# Patient Record
Sex: Female | Born: 1975 | Race: White | Hispanic: No | Marital: Married | Smoking: Former smoker
Health system: Southern US, Community
[De-identification: ages and names within clinical notes are randomized; demographics above are authoritative.]

## PROBLEM LIST (undated history)

## (undated) DIAGNOSIS — K219 Gastro-esophageal reflux disease without esophagitis: Secondary | ICD-10-CM

## (undated) DIAGNOSIS — F32A Depression, unspecified: Secondary | ICD-10-CM

## (undated) DIAGNOSIS — E282 Polycystic ovarian syndrome: Secondary | ICD-10-CM

## (undated) DIAGNOSIS — I10 Essential (primary) hypertension: Secondary | ICD-10-CM

## (undated) DIAGNOSIS — E119 Type 2 diabetes mellitus without complications: Secondary | ICD-10-CM

## (undated) DIAGNOSIS — F419 Anxiety disorder, unspecified: Secondary | ICD-10-CM

## (undated) DIAGNOSIS — R011 Cardiac murmur, unspecified: Secondary | ICD-10-CM

## (undated) DIAGNOSIS — G43909 Migraine, unspecified, not intractable, without status migrainosus: Secondary | ICD-10-CM

## (undated) HISTORY — DX: Cardiac murmur, unspecified: R01.1

## (undated) HISTORY — DX: Anxiety disorder, unspecified: F41.9

## (undated) HISTORY — DX: Type 2 diabetes mellitus without complications: E11.9

## (undated) HISTORY — DX: Depression, unspecified: F32.A

## (undated) HISTORY — PX: PILONIDAL CYST EXCISION: SHX744

## (undated) HISTORY — DX: Polycystic ovarian syndrome: E28.2

## (undated) HISTORY — DX: Gastro-esophageal reflux disease without esophagitis: K21.9

## (undated) HISTORY — DX: Migraine, unspecified, not intractable, without status migrainosus: G43.909

## (undated) HISTORY — DX: Essential (primary) hypertension: I10

---

## 2021-03-20 ENCOUNTER — Encounter: Payer: Self-pay | Admitting: Gastroenterology

## 2021-05-02 ENCOUNTER — Ambulatory Visit: Payer: Self-pay | Admitting: Gastroenterology

## 2021-05-13 ENCOUNTER — Ambulatory Visit: Payer: Self-pay | Admitting: Gastroenterology

## 2021-05-16 ENCOUNTER — Encounter: Payer: Self-pay | Admitting: Gastroenterology

## 2021-05-21 ENCOUNTER — Encounter: Payer: Self-pay | Admitting: Gastroenterology

## 2021-05-21 ENCOUNTER — Other Ambulatory Visit (INDEPENDENT_AMBULATORY_CARE_PROVIDER_SITE_OTHER): Payer: Managed Care, Other (non HMO)

## 2021-05-21 ENCOUNTER — Ambulatory Visit: Payer: Managed Care, Other (non HMO) | Admitting: Gastroenterology

## 2021-05-21 VITALS — BP 136/86 | HR 78 | Ht 62.0 in | Wt 183.1 lb

## 2021-05-21 DIAGNOSIS — R1013 Epigastric pain: Secondary | ICD-10-CM | POA: Diagnosis not present

## 2021-05-21 DIAGNOSIS — R1084 Generalized abdominal pain: Secondary | ICD-10-CM | POA: Diagnosis not present

## 2021-05-21 DIAGNOSIS — Z8719 Personal history of other diseases of the digestive system: Secondary | ICD-10-CM

## 2021-05-21 DIAGNOSIS — K219 Gastro-esophageal reflux disease without esophagitis: Secondary | ICD-10-CM

## 2021-05-21 LAB — LIPASE: Lipase: 55 U/L (ref 11.0–59.0)

## 2021-05-21 NOTE — Progress Notes (Signed)
HPI : Nancy Harvey is a very pleasant 45 year old female with history of diabetes, anxiety and depression who is referred to [place] by Denny Levy, PA for further evaluation of persistent abdominal pain and a recent history of pancreatitis states that she started having abdominal pain in June of this year.  Pain was located in the upper abdomen and was associated with sudden violent vomiting episodes.  The pain was constant and was severe (10 out of 10).  She was found to have an elevated lipase (196) and a subsequent ultrasound showed no gallstones, but did show a prominent pancreatic head.  She had been on Ozempic since March of this year.  This medication was stopped after the diagnosis of pancreatitis.  Her pain improved after stopping the Ozempic, but she has continued to have persistent symptoms of recurrent abdominal pain, which is similar in location and nature to the pain with her pancreatitis, but not as severe.  Pain does not daily, but does occur a few times a week.  When it does occur it tends to last for hours at a time.  She rates it as a 7 out of 10 most of the time.  She has bowel movements 1-2 times a day.  Her stools are frequently poorly formed which is been her usual ever since she started taking metformin.  She denies any vomiting episodes in the past month, but she does report continued poor appetite.  She has lost 10 pounds in the past couple months due to poor appetite. She has also bothered by chronic GERD symptoms which consist of burning pain in the chest and occasional regurgitation.  The symptoms have been present for a long time but are also much more bothersome in the past 3 months.  She has been taking Nexium with partial improvement. 10 years ago, she was having similar upper abdominal pain symptoms.  An ultrasound was done at that time for gallstones but this was negative.  She does not think her lipase was elevated at that time.  The symptoms resolved spontaneously. She has an  uncle who was diagnosed with pancreatic cancer in his late 7s.  The patient has a remote history of smoking and is a very rare drinker of alcohol.    Past Medical History:  Diagnosis Date   Anxiety    Depression    Diabetes mellitus ([place])    High blood pressure    Migraines    PCOD (polycystic ovarian disease)      Past Surgical History:  Procedure Laterality Date   PILONIDAL CYST EXCISION     Family History  Problem Relation Age of Onset   Diabetes Mother    CAD Mother    Asthma Father    Kidney cancer Sister    Crohn's disease Paternal Uncle    Pancreatic cancer Paternal Uncle    Colon cancer Neg Hx    Rectal cancer Neg Hx    Stomach cancer Neg Hx    Esophageal cancer Neg Hx    Social History   Tobacco Use   Smoking status: Former    Types: Cigarettes   Smokeless tobacco: Never  Vaping Use   Vaping Use: Never used  Substance Use Topics   Alcohol use: Never   Drug use: Never   Current Outpatient Medications  Medication Sig Dispense Refill   busPIRone (BUSPAR) 10 MG tablet Take 10 mg by mouth 3 (three) times daily.     esomeprazole (NEXIUM) 40 MG capsule Take 40 mg  by mouth daily at 12 noon.     lisinopril (ZESTRIL) 20 MG tablet Take 20 mg by mouth daily.     metFORMIN (GLUCOPHAGE) 500 MG tablet Take by mouth 2 (two) times daily with a meal.     No current facility-administered medications for this visit.   Not on File   Review of Systems: All systems reviewed and negative except where noted in HPI.    No results found.  Physical Exam: BP 136/86   Pulse 78   Ht 5' 2"  (1.575 m)   Wt 183 lb 2 oz (83.1 kg)   SpO2 98%   BMI 33.49 kg/m  Constitutional: Pleasant,well-developed, obese Caucasian female in no acute distress. HEENT: Normocephalic and atraumatic. Conjunctivae are normal. No scleral icterus.  Mallampati 2 Cardiovascular: Normal rate, regular rhythm.  Pulmonary/chest: Effort normal and breath sounds normal. No wheezing, rales or  rhonchi. Abdominal: Soft, nondistended, tenderness to palpation in the epigastrium, without rigidity or guarding. Bowel sounds active throughout. There are no masses palpable. No hepatomegaly. Extremities: no edema Neurological: Alert and oriented to person place and time. Skin: Skin is warm and dry. No rashes noted. Psychiatric: Normal mood and affect. Behavior is normal.  CBC No results found for: WBC, RBC, HGB, HCT, PLT, MCV, MCH, MCHC, RDW, LYMPHSABS, MONOABS, EOSABS, BASOSABS  CMP  No results found for: NA, K, CL, CO2, GLUCOSE, BUN, CREATININE, CALCIUM, PROT, ALBUMIN, AST, ALT, ALKPHOS, BILITOT, GFRNONAA, GFRAA   CBC June 10: White blood cell count 10.8, hemoglobin 13.4, platelets 294 CMP June 10: Unremarkable, AST 12, ALT 16, alk phos 94, T bili 0.4, normal electrolytes H. pylori IgG 0.38 Lipase june 10: 196  RUQUS June 16th 2022:  Mildly prominent pancreatic head, hepatic steatosis, no cholelithiasis  CBC June 28: Blood cell count 8.6, hemoglobin 12.3, platelets 372 Lipase June 28:  53  amylase June 28: 53   ASSESSMENT AND PLAN: 45 year old female with diabetes, anxiety and depression with a recent episode of pancreatitis in June of this year, with ultrasound negative for gallstones.  She was taking Ozempic at the time which was stopped, which improved her abdominal pain, but she continues to have persistent abdominal pain and anorexia nearly 3 months out from the episode of pancreatitis.  She has lost 10 pounds unintentionally.  It is unclear if her ongoing symptoms are at all related to the pancreatitis versus another unrelated etiology.  Recommended we get a CT scan with contrast of the abdomen pelvis to reassess her pancreas, exclude complications such as pseudocyst and also look for other potential causes of abdominal pain.  We will also repeat a lipase, which was persistently mildly elevated in late June.  Also perform an upper endoscopy to exclude luminal causes of epigastric  pain such as peptic ulcer disease.  Celiac serologies to exclude celiac disease   Abdominal pain, history of pancreatitis (suspect Ozempic), wt loss -CT abdomen pelvis with IV/p.o. contrast -Recheck lipase -EGD -Ttg/IgA  GERD - Continue Nexium daily - EGD to assess for complicated GERD  Colon cancer screening - Patient will be due for her screening colonoscopy next year.  If patient continues to lose weight and no etiology is found in the upper GI tract, we should perform this colonoscopy sooner  The details, risks (including bleeding, perforation, infection, missed lesions, medication reactions and possible hospitalization or surgery if complications occur), benefits, and alternatives to EGD with possible biopsy and possible polypectomy were discussed with the patient and she consents to proceed.  Nancy Harvey E. Candis Schatz, MD [facility]   CC:  Denny Levy, [place]

## 2021-05-21 NOTE — Patient Instructions (Signed)
If you are age 44 or older, your body mass index should be between 23-30. Your Body mass index is 33.49 kg/m. If this is out of the aforementioned range listed, please consider follow up with your Primary Care Provider.  If you are age 66 or younger, your body mass index should be between 19-25. Your Body mass index is 33.49 kg/m. If this is out of the aformentioned range listed, please consider follow up with your Primary Care Provider.   You have been scheduled for an endoscopy. Please follow written instructions given to you at your visit today. If you use inhalers (even only as needed), please bring them with you on the day of your procedure.   Your provider has requested that you go to the basement level for lab work before leaving today. Press "B" on the elevator. The lab is located at the first door on the left as you exit the elevator.  You have been scheduled for a CT scan of the abdomen and pelvis at [facility] ([address]---this is in the same building as [employer]).   You are scheduled on 05/28/21 at 8:30am. You should arrive 15 minutes prior to your appointment time for registration. Please follow the written instructions below on the day of your exam:  WARNING: IF YOU ARE ALLERGIC TO IODINE/X-RAY DYE, PLEASE NOTIFY RADIOLOGY IMMEDIATELY AT [phone]! YOU WILL BE GIVEN A 13 HOUR PREMEDICATION PREP.  1) Do not eat or drink anything after 4:30am (4 hours prior to your test) 2) You have been given 2 bottles of oral contrast to drink. The solution may taste better if refrigerated, but do NOT add ice or any other liquid to this solution. Shake well before drinking.    Drink 1 bottle of contrast @ 6:30am (2 hours prior to your exam)  Drink 1 bottle of contrast @ 7:30am (1 hour prior to your exam)  You may take any medications as prescribed with a small amount of water, if necessary. If you take any of the following medications: METFORMIN, GLUCOPHAGE,  GLUCOVANCE, AVANDAMET, RIOMET, FORTAMET, [place] MET, JANUMET, GLUMETZA or METAGLIP, you MAY be asked to HOLD this medication 48 hours AFTER the exam.  The purpose of you drinking the oral contrast is to aid in the visualization of your intestinal tract. The contrast solution may cause some diarrhea. Depending on your individual set of symptoms, you may also receive an intravenous injection of x-ray contrast/dye. Plan on being at [facility] for 30 minutes or longer, depending on the type of exam you are having performed.  This test typically takes 30-45 minutes to complete.  If you have any questions regarding your exam or if you need to reschedule, you may call the CT department at [phone] between the hours of 8:00 am and 5:00 pm, Monday-Friday.  The [facility] providers would like to encourage you to use [facility] to communicate with providers for non-urgent requests or questions.  Due to long hold times on the telephone, sending your provider a message by [facility] may be a faster and more efficient way to get a response.  Please allow 48 business hours for a response.  Please remember that this is for non-urgent requests.   Due to recent changes in healthcare laws, you may see the results of your imaging and laboratory studies on MyChart before your provider has had a chance to review them.  We understand that in some cases there may be results that are confusing or  concerning to you. Not all laboratory results come back in the same time frame and the provider may be waiting for multiple results in order to interpret others.  Please give [place] 48 hours in order for your provider to thoroughly review all the results before contacting the office for clarification of your results.    It was a pleasure to see you today!  Thank you for trusting me with your gastrointestinal care!    Scott E. Candis Schatz, MD

## 2021-05-22 LAB — TISSUE TRANSGLUTAMINASE, IGA: (tTG) Ab, IgA: <1 U/mL

## 2021-05-22 LAB — IGA: Immunoglobulin A: 354 mg/dL — ABNORMAL HIGH (ref 47–310)

## 2021-05-28 ENCOUNTER — Inpatient Hospital Stay: Admission: RE | Admit: 2021-05-28 | Payer: Managed Care, Other (non HMO) | Source: Ambulatory Visit

## 2021-05-29 NOTE — Progress Notes (Signed)
Nancy Harvey, your lipase and celiac tests were normal.  Your total IgA level was slightly elevated which is a nonspecific finding without clinical significance and is does not require any further evaluation.  Please plan to proceed with the CT scan and the upper endoscopy as previously discussed.

## 2021-06-04 ENCOUNTER — Other Ambulatory Visit: Payer: Self-pay

## 2021-06-04 ENCOUNTER — Telehealth: Payer: Self-pay

## 2021-06-04 DIAGNOSIS — R1013 Epigastric pain: Secondary | ICD-10-CM

## 2021-06-04 DIAGNOSIS — Z8719 Personal history of other diseases of the digestive system: Secondary | ICD-10-CM

## 2021-06-04 NOTE — Telephone Encounter (Signed)
Called patient to inform her that she would need labs before her CT scan and they needed to be done by Monday.

## 2021-06-09 NOTE — Telephone Encounter (Signed)
Inbound call from patient's husband requesting a call in regards to the lab she needs to have done please.

## 2021-06-09 NOTE — Telephone Encounter (Signed)
Called and Left VM for patient's husband to call me back.

## 2021-06-09 NOTE — Telephone Encounter (Signed)
Spoke with patient husband. He wanted to know why BMET was not taken when patient got other lab work. I explained to him that it was not ordered when she was here and that CT called [place] to let us know patient needed BMET. Patient's husband had no questions at the end of call.

## 2021-06-10 ENCOUNTER — Inpatient Hospital Stay: Admission: RE | Admit: 2021-06-10 | Payer: Managed Care, Other (non HMO) | Source: Ambulatory Visit

## 2021-06-23 ENCOUNTER — Other Ambulatory Visit (INDEPENDENT_AMBULATORY_CARE_PROVIDER_SITE_OTHER): Payer: Managed Care, Other (non HMO)

## 2021-06-23 DIAGNOSIS — Z8719 Personal history of other diseases of the digestive system: Secondary | ICD-10-CM

## 2021-06-23 DIAGNOSIS — R1013 Epigastric pain: Secondary | ICD-10-CM

## 2021-06-23 LAB — BASIC METABOLIC PANEL
BUN: 19 mg/dL (ref 6–23)
CO2: 25 mEq/L (ref 19–32)
Calcium: 9.5 mg/dL (ref 8.4–10.5)
Chloride: 101 mEq/L (ref 96–112)
Creatinine, Ser: 0.77 mg/dL (ref 0.40–1.20)
GFR: 93.69 mL/min (ref >60.00)
Glucose, Bld: 160 mg/dL — ABNORMAL HIGH (ref 70–99)
Potassium: 4.2 mEq/L (ref 3.5–5.1)
Sodium: 136 mEq/L (ref 135–145)

## 2021-06-26 ENCOUNTER — Other Ambulatory Visit: Payer: Self-pay

## 2021-06-26 ENCOUNTER — Ambulatory Visit (INDEPENDENT_AMBULATORY_CARE_PROVIDER_SITE_OTHER)
Admission: RE | Admit: 2021-06-26 | Discharge: 2021-06-26 | Disposition: A | Discharge status: Home / Self Care | Payer: Managed Care, Other (non HMO) | Source: Ambulatory Visit | Attending: Gastroenterology | Admitting: Gastroenterology

## 2021-06-26 DIAGNOSIS — R1013 Epigastric pain: Secondary | ICD-10-CM

## 2021-06-26 DIAGNOSIS — Z8719 Personal history of other diseases of the digestive system: Secondary | ICD-10-CM

## 2021-06-26 MED ORDER — IOHEXOL 350 MG/ML SOLN
100.0000 mL | Freq: Once | INTRAVENOUS | Status: AC | PRN
  Administered 2021-06-26: 100 mL via INTRAVENOUS

## 2021-07-01 NOTE — Progress Notes (Signed)
Nancy Harvey,  Your CT showed no abnormalities of the pancreas which is good.  It did note a moderate amount of stool retained in the colon, which may indicate that some of abdominal discomfort may improve with better bowel movements.  If you have not tried taking a fiber supplement such as Metamucil, I would suggest taking that on a daily basis for the next month to see if that helps.  Your CT also noted some mild fatty change in the liver.  This is a fairly common finding and is usually related to metabolic changes seen in patients with diabetes and obesity.  Chronic alcohol use can also cause fatty change to the liver.  I recommend you follow up with me in the clinic after your upper endoscopy to further discuss fatty liver as well as your chronic GI symptoms.  Continue to consume alcohol sparingly or avoid it completely.

## 2021-07-25 ENCOUNTER — Ambulatory Visit (AMBULATORY_SURGERY_CENTER): Payer: Managed Care, Other (non HMO) | Admitting: Gastroenterology

## 2021-07-25 ENCOUNTER — Encounter: Payer: Self-pay | Admitting: Gastroenterology

## 2021-07-25 ENCOUNTER — Other Ambulatory Visit: Payer: Self-pay

## 2021-07-25 VITALS — BP 125/88 | HR 80 | Temp 97.0°F | Resp 17 | Ht 62.0 in | Wt 183.0 lb

## 2021-07-25 DIAGNOSIS — K2100 Gastro-esophageal reflux disease with esophagitis, without bleeding: Secondary | ICD-10-CM

## 2021-07-25 DIAGNOSIS — R1013 Epigastric pain: Secondary | ICD-10-CM | POA: Diagnosis not present

## 2021-07-25 DIAGNOSIS — K219 Gastro-esophageal reflux disease without esophagitis: Secondary | ICD-10-CM

## 2021-07-25 MED ORDER — SODIUM CHLORIDE 0.9 % IV SOLN
500.0000 mL | Freq: Once | INTRAVENOUS | Status: DC
Start: 2021-07-25 — End: 2021-07-25

## 2021-07-25 MED ORDER — ESOMEPRAZOLE MAGNESIUM 40 MG PO CPDR: 40.0000 mg | DELAYED_RELEASE_CAPSULE | Freq: Two times a day (BID) | ORAL | 1 refills | Status: DC

## 2021-07-25 NOTE — Progress Notes (Signed)
Called to room to assist during endoscopic procedure.  Patient ID and intended procedure confirmed with present staff. Received instructions for my participation in the procedure from the performing physician.  

## 2021-07-25 NOTE — Patient Instructions (Addendum)
Increase Nexium to twice a day for at least 4 weeks. Pick up prescription for [facility] at [facility] in [place], [place]. Await pathology results. Can consider trial of alternate PPI if symptoms do not improve with the Nexium twice a day. Resume previous diet and continue present medications.    YOU HAD AN ENDOSCOPIC PROCEDURE TODAY AT THE [facility]:   Refer to the procedure report that was given to you for any specific questions about what was found during the examination.  If the procedure report does not answer your questions, please call your gastroenterologist to clarify.  If you requested that your care partner not be given the details of your procedure findings, then the procedure report has been included in a sealed envelope for you to review at your convenience later.  YOU SHOULD EXPECT: Some feelings of bloating in the abdomen. Passage of more gas than usual.  Walking can help get rid of the air that was put into your GI tract during the procedure and reduce the bloating. If you had a lower endoscopy (such as a colonoscopy or flexible sigmoidoscopy) you may notice spotting of blood in your stool or on the toilet paper. If you underwent a bowel prep for your procedure, you may not have a normal bowel movement for a few days.  Please Note:  You might notice some irritation and congestion in your nose or some drainage.  This is from the oxygen used during your procedure.  There is no need for concern and it should clear up in a day or so.  SYMPTOMS TO REPORT IMMEDIATELY:  Following upper endoscopy (EGD)  Vomiting of blood or coffee ground material  New chest pain or pain under the shoulder blades  Painful or persistently difficult swallowing  New shortness of breath  Fever of 100F or higher  Black, tarry-looking stools  For urgent or emergent issues, a gastroenterologist can be reached at any hour by calling [phone]. Do not use MyChart messaging for urgent concerns.     DIET:  We do recommend a small meal at first, but then you may proceed to your regular diet.  Drink plenty of fluids but you should avoid alcoholic beverages for 24 hours.  ACTIVITY:  You should plan to take it easy for the rest of today and you should NOT DRIVE or use heavy machinery until tomorrow (because of the sedation medicines used during the test).    FOLLOW UP: Our staff will call the number listed on your records 48-72 hours following your procedure to check on you and address any questions or concerns that you may have regarding the information given to you following your procedure. If we do not reach you, we will leave a message.  We will attempt to reach you two times.  During this call, we will ask if you have developed any symptoms of COVID 19. If you develop any symptoms (ie: fever, flu-like symptoms, shortness of breath, cough etc.) before then, please call [phone].  If you test positive for Covid 19 in the 2 weeks post procedure, please call and report this information to [place].    If any biopsies were taken you will be contacted by phone or by letter within the next 1-3 weeks.  Please call us at [phone] if you have not heard about the biopsies in 3 weeks.    SIGNATURES/CONFIDENTIALITY: You and/or your care partner have signed paperwork which will be entered into your electronic medical record.  These  signatures attest to the fact that that the information above on your After Visit Summary has been reviewed and is understood.  Full responsibility of the confidentiality of this discharge information lies with you and/or your care-partner.

## 2021-07-25 NOTE — Progress Notes (Signed)
CHECK-IN-AER  V/S-[place] 

## 2021-07-25 NOTE — Progress Notes (Signed)
To pacu, VSS. Report to [email or web address].tb 

## 2021-07-25 NOTE — Progress Notes (Signed)
[facility] History and Physical   Primary Care Physician:  Estanislado PandySasser, Paul W, MD   Reason for Procedure:   Epigastric pain/GERD  Plan:    EGD     HPI: Nancy Harvey is a 45 y.o. female undergoing EGD to evaluate persistent epigastric pain and chronic GERD symptoms.  Symptoms persistent despite daily Nexium.  Epigastric pain has improved since her clinic visit in September, but her GERD symptoms are persistent.  No dysphagia.  Past Medical History:  Diagnosis Date   Anxiety    Depression    Diabetes mellitus ([facility])    GERD (gastroesophageal reflux disease)    Heart murmur    High blood pressure    Migraines    PCOD (polycystic ovarian disease)     Past Surgical History:  Procedure Laterality Date   CESAREAN SECTION  2005   PILONIDAL CYST EXCISION      Prior to Admission medications   Medication Sig Start Date End Date Taking? Authorizing Provider  busPIRone (BUSPAR) 10 MG tablet Take 10 mg by mouth 3 (three) times daily.   Yes [provider]  esomeprazole (NEXIUM) 40 MG capsule Take 40 mg by mouth daily at 12 noon.   Yes [provider]  GLYXAMBI 10-5 MG TABS Take 1 tablet by mouth daily. 06/25/21  Yes [provider]  lisinopril (ZESTRIL) 20 MG tablet Take 20 mg by mouth daily.   Yes [provider]  metFORMIN (GLUCOPHAGE) 500 MG tablet Take by mouth 2 (two) times daily with a meal.   Yes [provider]  Venlafaxine HCl (EFFEXOR PO) Take by mouth. DOSAGE UNKNOWN TAKE ONCE DAILY.   Yes [provider]    Current Outpatient Medications  Medication Sig Dispense Refill   busPIRone (BUSPAR) 10 MG tablet Take 10 mg by mouth 3 (three) times daily.     esomeprazole (NEXIUM) 40 MG capsule Take 40 mg by mouth daily at 12 noon.     GLYXAMBI 10-5 MG TABS Take 1 tablet by mouth daily.     lisinopril (ZESTRIL) 20 MG tablet Take 20 mg by mouth daily.     metFORMIN (GLUCOPHAGE) 500 MG tablet Take by mouth 2 (two) times  daily with a meal.     Venlafaxine HCl (EFFEXOR PO) Take by mouth. DOSAGE UNKNOWN TAKE ONCE DAILY.     Current Facility-Administered Medications  Medication Dose Route Frequency Provider Last Rate Last Admin   0.9 %  sodium chloride infusion  500 mL Intravenous Once Jenel Lucksunningham, Nidhi Jacome E, MD        Allergies as of 07/25/2021   (No Known Allergies)    Family History  Problem Relation Age of Onset   Diabetes Mother    CAD Mother    Asthma Father    Kidney cancer Sister    Crohn's disease Paternal Uncle    Pancreatic cancer Paternal Uncle    Colon cancer Neg Hx    Rectal cancer Neg Hx    Stomach cancer Neg Hx    Esophageal cancer Neg Hx     Social History   Socioeconomic History   Marital status: Married    Spouse name: Not on file   Number of children: 1   Years of education: Not on file   Highest education level: Not on file  Occupational History   Not on file  Tobacco Use   Smoking status: Former    Types: Cigarettes   Smokeless tobacco: Never  Vaping Use   Vaping Use: Never  used  Substance and Sexual Activity   Alcohol use: Never   Drug use: Never   Sexual activity: Not on file  Other Topics Concern   Not on file  Social History Narrative   Not on file   Social Determinants of Health   Financial Resource Strain: Not on file  Food Insecurity: Not on file  Transportation Needs: Not on file  Physical Activity: Not on file  Stress: Not on file  Social Connections: Not on file  Intimate Partner Violence: Not on file    Review of Systems:  All other review of systems negative except as mentioned in the HPI.  Physical Exam: Vital signs BP 132/78 (Patient Position: Sitting)   Pulse 80   Temp (!) 97 F (36.1 C)   Ht 5\' 2"  (1.575 m)   Wt 183 lb (83 kg)   SpO2 97%   BMI 33.47 kg/m   General:   Alert,  Well-developed, well-nourished, pleasant and cooperative in NAD Airway:  Mallampati 2 Lungs:  Clear throughout to auscultation.   Heart:  Regular  rate and rhythm; no murmurs, clicks, rubs,  or gallops. Abdomen:  Soft, nontender and nondistended. Normal bowel sounds.   Neuro/Psych:  Normal mood and affect. A and O x 3   Mersedes Alber E. , MD [facility]

## 2021-07-25 NOTE — Op Note (Signed)
[facility] Patient Name: Nancy Harvey Procedure Date: 07/25/2021 8:32 AM MRN: [id number] Endoscopist: Lorin Picket E. Tomasa Rand , MD Age: 45 Referring MD:  Date of Birth: [birth year] Gender: Female Account #: [id number] Procedure:                Upper GI endoscopy Indications:              Epigastric abdominal pain, Esophageal reflux                            symptoms that persist despite appropriate therapy Medicines:                Monitored Anesthesia Care Procedure:                Pre-Anesthesia Assessment:                           - Prior to the procedure, a History and Physical                            was performed, and patient medications and                            allergies were reviewed. The patient's tolerance of                            previous anesthesia was also reviewed. The risks                            and benefits of the procedure and the sedation                            options and risks were discussed with the patient.                            All questions were answered, and informed consent                            was obtained. Prior Anticoagulants: The patient has                            taken no previous anticoagulant or antiplatelet                            agents. ASA Grade Assessment: II - A patient with                            mild systemic disease. After reviewing the risks                            and benefits, the patient was deemed in                            satisfactory condition to undergo the procedure.  After obtaining informed consent, the endoscope was                            passed under direct vision. Throughout the                            procedure, the patient's blood pressure, pulse, and                            oxygen saturations were monitored continuously. The                            GIF [id number] #[id number] was introduced through the                            mouth, and  advanced to the third part of duodenum.                            The upper GI endoscopy was accomplished without                            difficulty. The patient tolerated the procedure                            well. Scope In: Scope Out: Findings:                 The examined portions of the nasopharynx,                            oropharynx and larynx were normal.                           LA Grade A (one or more mucosal breaks less than 5                            mm, not extending between tops of 2 mucosal folds)                            esophagitis was found at the gastroesophageal                            junction.                           Scattered mild mucosal changes characterized by                            longitudinal markings were found in the lower third                            of the esophagus. Biopsies were obtained from the                            proximal and distal esophagus with  cold forceps for                            histology of suspected eosinophilic esophagitis.                            Estimated blood loss was minimal.                           The gastroesophageal flap valve was visualized                            endoscopically and classified as Hill Grade IV (no                            fold, wide open lumen, hiatal hernia present).                           The entire examined stomach was normal. Biopsies                            were taken with a cold forceps for Helicobacter                            pylori testing. Estimated blood loss was minimal.                           The examined duodenum was normal. Complications:            No immediate complications. Estimated Blood Loss:     Estimated blood loss was minimal. Impression:               - The examined portions of the nasopharynx,                            oropharynx and larynx were normal.                           - LA Grade A reflux esophagitis.                            - Longitudinally marked mucosa in the esophagus.                            Biopsied. Likely secondary to acid reflux, biopsies                            taken to rule out eosinophilic esophagitis                           - Gastroesophageal flap valve classified as Hill                            Grade IV (no fold, wide open lumen, hiatal hernia  present).                           - Normal stomach. Biopsied.                           - Normal examined duodenum. Recommendation:           - Patient has a contact number available for                            emergencies. The signs and symptoms of potential                            delayed complications were discussed with the                            patient. Return to normal activities tomorrow.                            Written discharge instructions were provided to the                            patient.                           - Resume previous diet.                           - Continue present medications.                           - Await pathology results.                           - Increase Nexium to twice a day for 4 weeks                           - Consider trial of alternate PPI if symptoms do                            not improve with BID Nexium. Oland Arquette E. Tomasa Rand, MD 07/25/2021 8:57:57 AM This report has been signed electronically.

## 2021-07-29 ENCOUNTER — Telehealth: Payer: Self-pay

## 2021-07-29 NOTE — Telephone Encounter (Signed)
  Follow up Call-  Call back number 07/25/2021  Post procedure Call Back phone  # [phone]  Permission to leave phone message Yes     Patient questions:  Do you have a fever, pain , or abdominal swelling? No. Pain Score  0 *  Have you tolerated food without any problems? Yes.    Have you been able to return to your normal activities? Yes.    Do you have any questions about your discharge instructions: Diet   No. Medications  No. Follow up visit  No.  Do you have questions or concerns about your Care? No.  Actions: * If pain score is 4 or above: No action needed, pain <4.  Have you developed a fever since your procedure? no  2.   Have you had an respiratory symptoms (SOB or cough) since your procedure? no  3.   Have you tested positive for COVID 19 since your procedure no  4.   Have you had any family members/close contacts diagnosed with the COVID 19 since your procedure?  no   If yes to any of these questions please route to Laverna Peace, RN and Karlton Lemon, RN

## 2021-08-02 NOTE — Progress Notes (Signed)
Nancy Harvey,  The biopsies of your stomach were unremarkable.  The biopsies of your esophagus showed some inflammatory changes and elevated levels of eosinophils, but not high enough to meet criteria for eosinophilic esophagitis in the proximal esophagus.  I suspect that the elevated eosinophils are related to acid reflux, not eosinophilic esophagitis.  I recommend continuing the higher dose of Nexium as discussed.  If no improvement in your symptoms, we can try another acid suppressing medication.  Please reach out to [place] via telephone or MyChart to let us know how you are doing.

## 2021-08-18 ENCOUNTER — Other Ambulatory Visit: Payer: Self-pay | Admitting: Gastroenterology

## 2021-08-18 DIAGNOSIS — K2100 Gastro-esophageal reflux disease with esophagitis, without bleeding: Secondary | ICD-10-CM

## 2021-08-18 DIAGNOSIS — R1013 Epigastric pain: Secondary | ICD-10-CM

## 2022-08-30 IMAGING — CT CT ABD-PELV W/ CM
2 of 5 series · 16 of 46 positions shown, 18 images · IV contrast (OMNIPAQUE 300)
Comparison: None.

CLINICAL DATA: Epigastric abdominal pain with nausea and vomiting
since [REDACTED]. History of pancreatitis.

EXAM:
CT ABDOMEN AND PELVIS WITH CONTRAST
TECHNIQUE: Multidetector CT imaging of the abdomen and pelvis was performed
using the standard protocol following bolus administration of
intravenous contrast.
CONTRAST:  100mL OMNIPAQUE IOHEXOL 350 MG/ML SOLN

[Series 2: abd/pel w · axial · 0.81mm/px · z∈[-464,-34]mm · 13 of 98 slices shown, 15 images]
[im 6/98  soft-tissue]
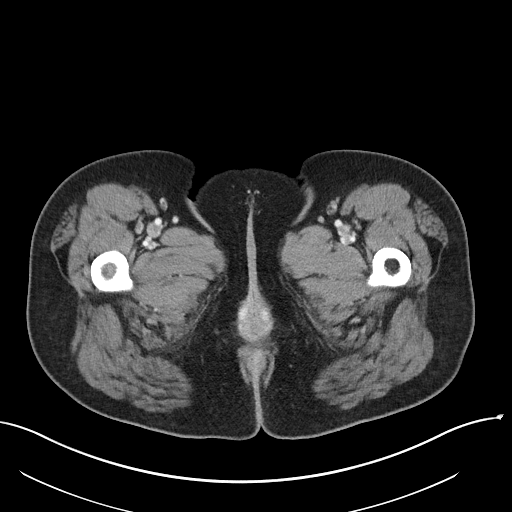
[im 6/98  bone]
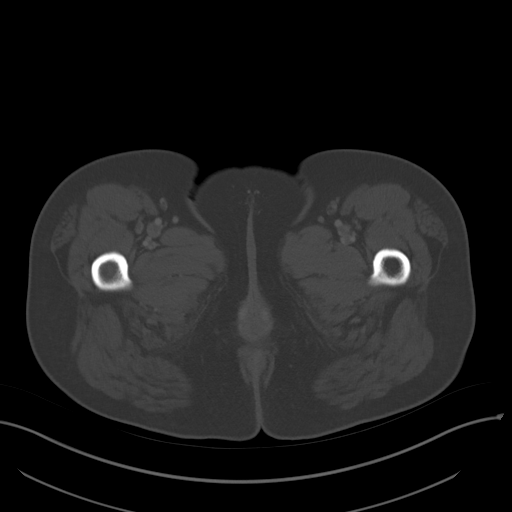
[im 16/98  soft-tissue]
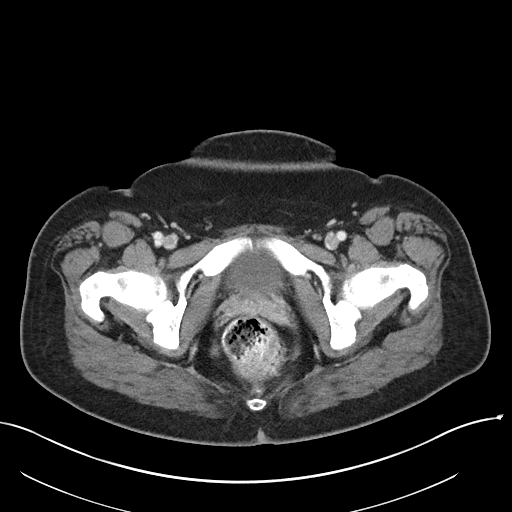
[im 21/98  soft-tissue]
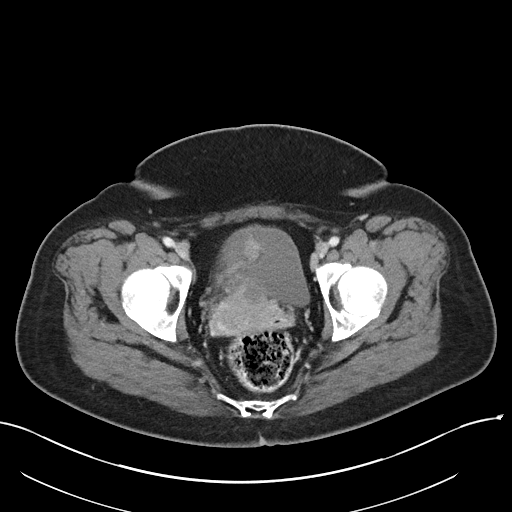
[im 26/98  soft-tissue]
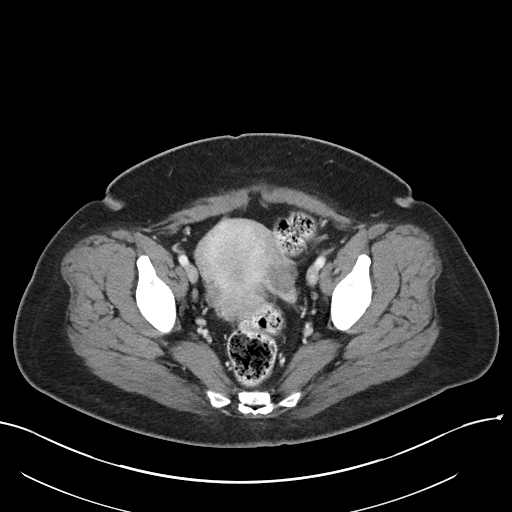
[im 36/98  soft-tissue]
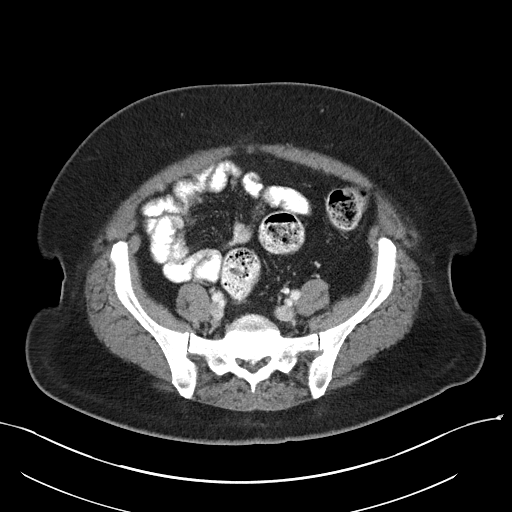
[im 41/98  soft-tissue]
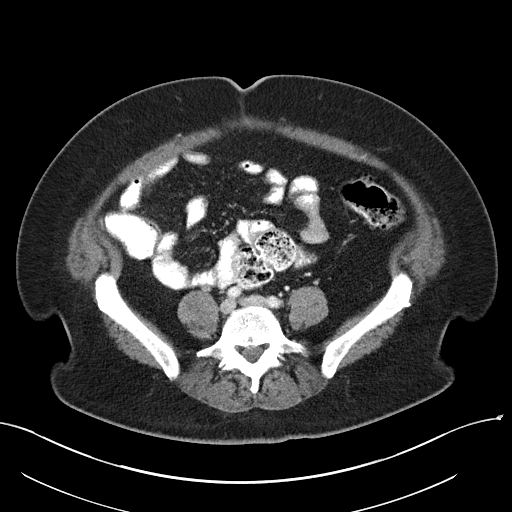
[im 52/98  soft-tissue]
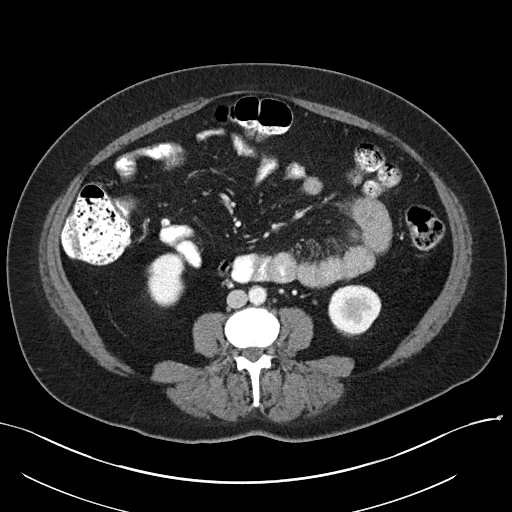
[im 57/98  soft-tissue]
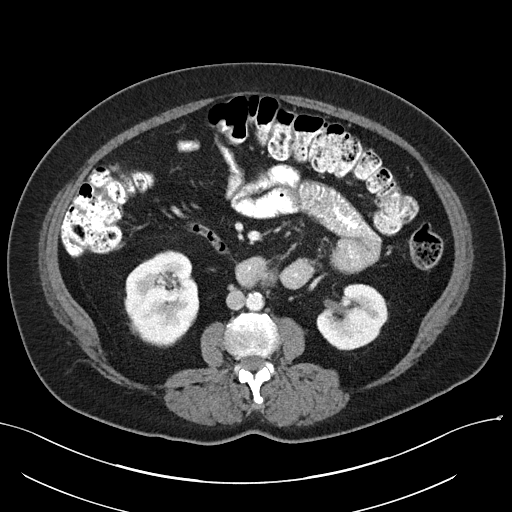
[im 62/98  soft-tissue]
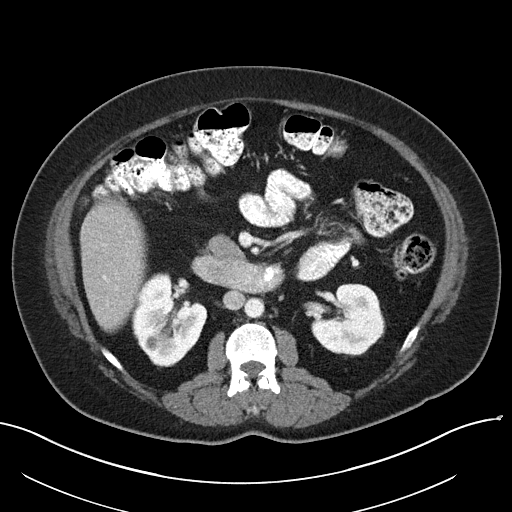
[im 62/98  bone]
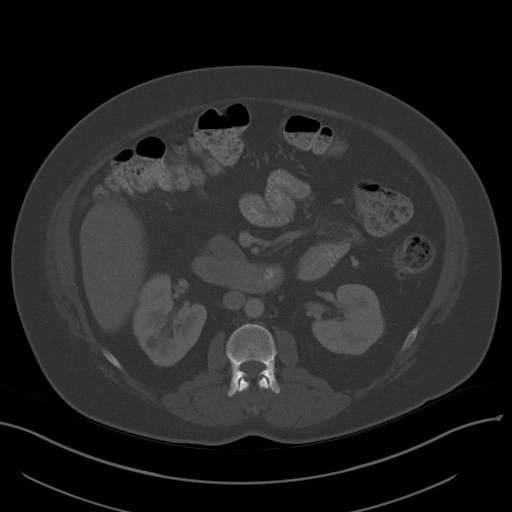
[im 72/98  soft-tissue]
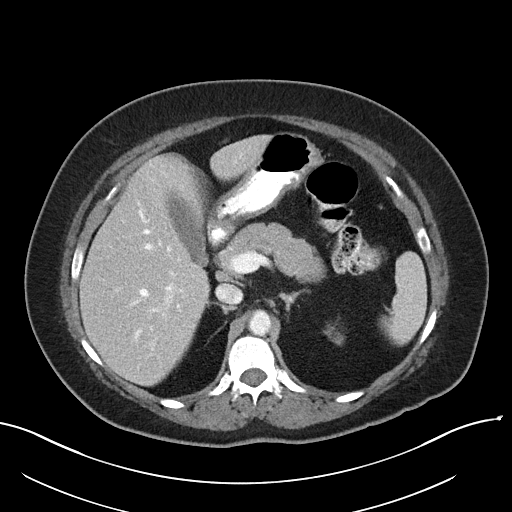
[im 77/98  soft-tissue]
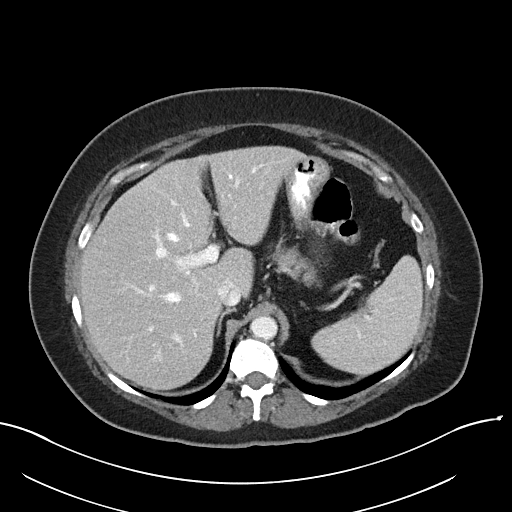
[im 82/98  soft-tissue]
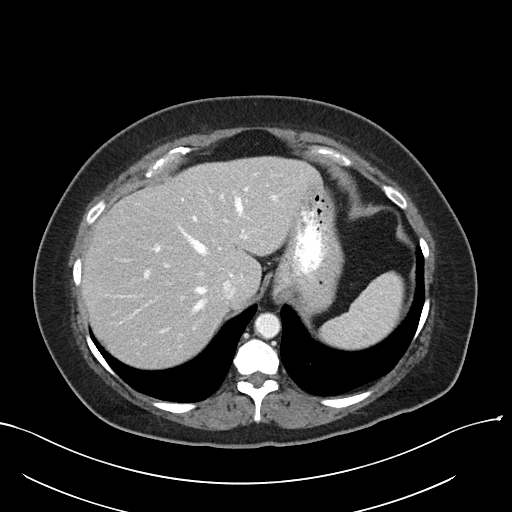
[im 92/98  soft-tissue]
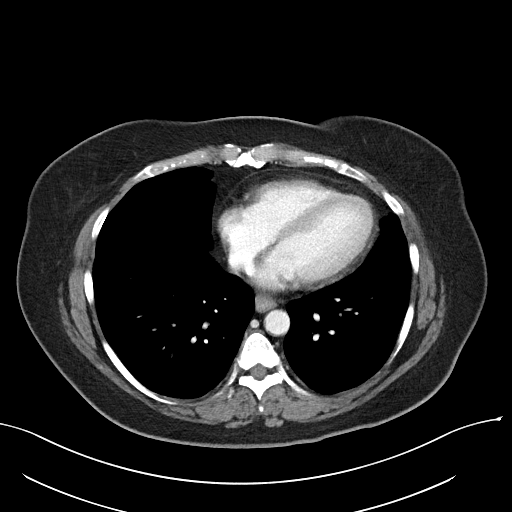

[Series 5: coronal st · coronal · 0.76mm/px · 3 of 94 slices shown]
[im 32/94  soft-tissue]
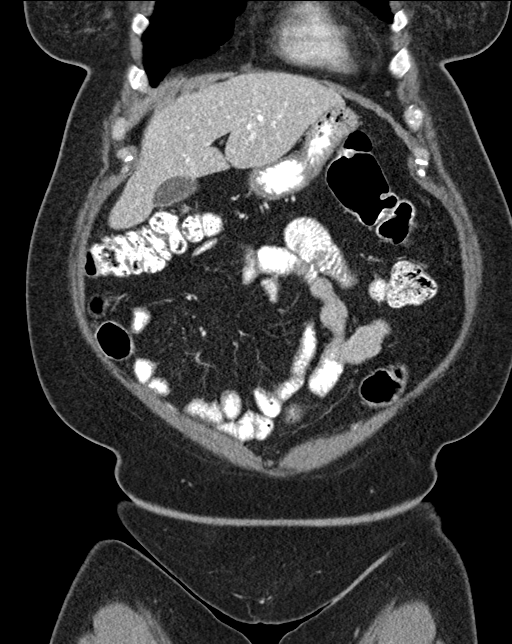
[im 42/94  soft-tissue]
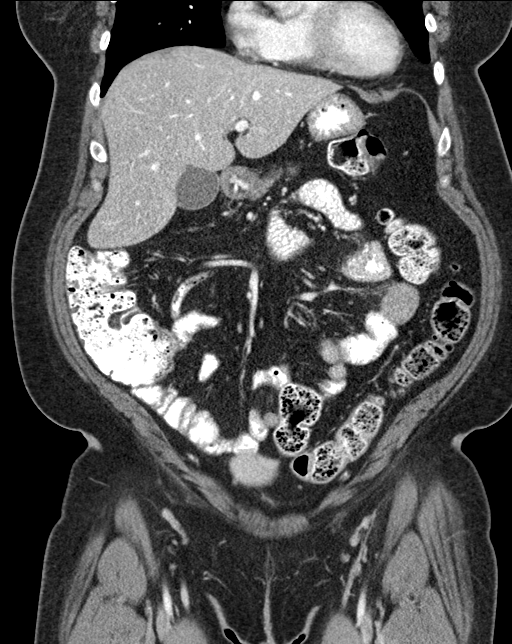
[im 52/94  soft-tissue]
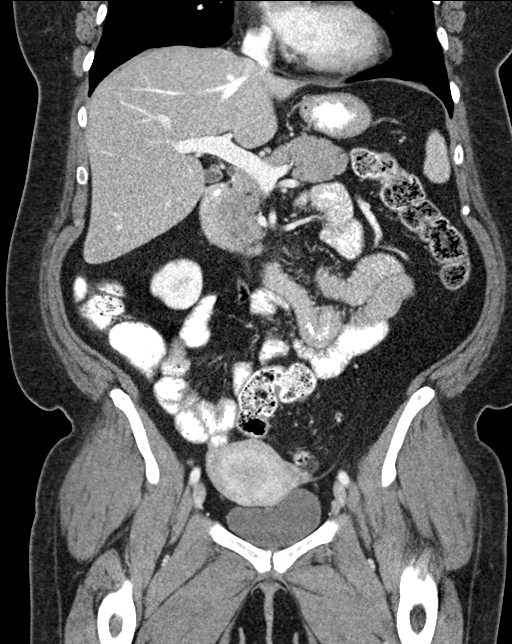

[16 of 46 positions shown; findings below may reference images not displayed]

FINDINGS: Lower chest: No acute abnormality.

Hepatobiliary: Mild diffuse hepatic steatosis. Gallbladder is
unremarkable. No biliary ductal dilation.

Pancreas: No pancreatic ductal dilation or evidence of acute/chronic
inflammation.

Spleen: Within normal limits.

Adrenals/Urinary Tract: Bilateral adrenal glands are unremarkable.
No hydronephrosis. Lobular renal contour. Hypodense subcentimeter
right interpolar renal lesion which is technically too small to
accurately characterize but statistically likely to reflect a cyst.
No solid enhancing renal mass. Urinary bladder is unremarkable.

Stomach/Bowel: Radiopaque enteric contrast traverses the rectum.
Small hiatal hernia otherwise the stomach is unremarkable for degree
of distension. No pathologic dilation of large or small bowel. The
appendix and terminal ileum appear normal. Sigmoid and descending
colonic diverticulosis without findings of acute diverticulitis.
Moderate volume of formed stool throughout the colon.

Vascular/Lymphatic: Aortic atherosclerosis without abdominal aortic
aneurysm. No pathologically enlarged abdominal or pelvic lymph
nodes.

Reproductive: Uterine leiomyoma. Cervical nabothian cysts. No
suspicious adnexal mass.

Other: No pneumoperitoneum.  No abdominopelvic free fluid.

Musculoskeletal: No acute or significant osseous findings.
IMPRESSION: 1. No acute findings in the abdomen or pelvis, specifically no
evidence of pancreatitis.
2. Colonic diverticulosis without findings of acute diverticulitis.
3. Moderate volume of formed stool throughout the colon.
4. Mild hepatic steatosis.
5. Aortic Atherosclerosis ([id number].O).
